# Patient Record
Sex: Male | Born: 1975 | Race: White | Hispanic: No | Marital: Married | Smoking: Never smoker
Health system: Southern US, Community
[De-identification: ages and names within clinical notes are randomized; demographics above are authoritative.]

---

## 2014-06-15 ENCOUNTER — Encounter: Payer: Self-pay | Admitting: Family Medicine

## 2014-06-15 ENCOUNTER — Ambulatory Visit (INDEPENDENT_AMBULATORY_CARE_PROVIDER_SITE_OTHER): Payer: BLUE CROSS/BLUE SHIELD | Admitting: Family Medicine

## 2014-06-15 VITALS — BP 139/94 | HR 64 | Ht 71.0 in | Wt 287.0 lb

## 2014-06-15 DIAGNOSIS — IMO0001 Reserved for inherently not codable concepts without codable children: Secondary | ICD-10-CM

## 2014-06-15 DIAGNOSIS — N529 Male erectile dysfunction, unspecified: Secondary | ICD-10-CM | POA: Insufficient documentation

## 2014-06-15 DIAGNOSIS — G5710 Meralgia paresthetica, unspecified lower limb: Secondary | ICD-10-CM | POA: Insufficient documentation

## 2014-06-15 DIAGNOSIS — R030 Elevated blood-pressure reading, without diagnosis of hypertension: Secondary | ICD-10-CM | POA: Diagnosis not present

## 2014-06-15 DIAGNOSIS — G5711 Meralgia paresthetica, right lower limb: Secondary | ICD-10-CM | POA: Diagnosis not present

## 2014-06-15 LAB — COMPLETE METABOLIC PANEL WITH GFR
ALBUMIN: 4.4 g/dL (ref 3.5–5.2)
ALT: 19 U/L (ref 0–53)
AST: 28 U/L (ref 0–37)
Alkaline Phosphatase: 49 U/L (ref 39–117)
BUN: 8 mg/dL (ref 6–23)
CALCIUM: 9.7 mg/dL (ref 8.4–10.5)
CO2: 24 mEq/L (ref 19–32)
Chloride: 104 mEq/L (ref 96–112)
Creat: 0.94 mg/dL (ref 0.50–1.35)
GFR, Est African American: >89 mL/min
GFR, Est Non African American: >89 mL/min
Glucose, Bld: 80 mg/dL (ref 70–99)
POTASSIUM: 4.4 meq/L (ref 3.5–5.3)
Sodium: 139 mEq/L (ref 135–145)
Total Bilirubin: 0.5 mg/dL (ref 0.2–1.2)
Total Protein: 7.8 g/dL (ref 6.0–8.3)

## 2014-06-15 NOTE — Progress Notes (Signed)
CC: Epimenio SarinDerrick Osborne is a 39 y.o. male is here for Numbness   Subjective: HPI:   Pleasant 39 year old here to establish care, husband of Devonda Jefferies  Complains of a numbness and tingling sensation on the right thigh localized at the distal lateral aspect of the thigh. Been present for about a month. Almost absent in severity when nothing is touching the skin however with any touch symptoms are mild in severity. He denies any recent or remote trauma or joint pain. Denies any coordination difficulty or any other motor or sensory disturbances in any of the extremities. He denies any back pain whatsoever. Symptoms are not getting better or worse since onset and no interventions as of yet. Denies any overlying skin changes at the site.   Complains of difficulty maintaining an erection that has been present for the past 1 or 2 months. He's had no difficulty initiating erection and still feels that he has a strong libido. He notices that symptoms are worsening if he thinks about it while having sex. No interventions as of yet. Denies any other genitourinary complaints including but not limited to dysuria, penile pain, testicular pain or any penile lesions. He is still attracted to his wife.  Review of Systems - General ROS: negative for - chills, fever, night sweats, weight gain or weight loss Ophthalmic ROS: negative for - decreased vision Psychological ROS: negative for - anxiety or depression ENT ROS: negative for - hearing change, nasal congestion, tinnitus or allergies Hematological and Lymphatic ROS: negative for - bleeding problems, bruising or swollen lymph nodes Breast ROS: negative Respiratory ROS: no cough, shortness of breath, or wheezing Cardiovascular ROS: no chest pain or dyspnea on exertion Gastrointestinal ROS: no abdominal pain, change in bowel habits, or black or bloody stools Genito-Urinary ROS: negative for - genital discharge, genital ulcers, incontinence or abnormal bleeding  from genitals Musculoskeletal ROS: negative for - joint pain or muscle pain Neurological ROS: negative for - headaches or memory loss Dermatological ROS: negative for lumps, mole changes, rash and skin lesion changes  No past medical history on file.  No past surgical history on file. Family History  Problem Relation Age of Onset  . Alcohol abuse Father   . Diabetes Other   . Hyperlipidemia Other   . Hypertension Other   . Stroke Other   . Heart attack Other     History   Social History  . Marital Status: Married    Spouse Name: N/A  . Number of Children: N/A  . Years of Education: N/A   Occupational History  . Not on file.   Social History Main Topics  . Smoking status: Never Smoker   . Smokeless tobacco: Not on file  . Alcohol Use: 0.0 oz/week    0 Standard drinks or equivalent per week     Comment: Very rarely  . Drug Use: Not on file  . Sexual Activity: Yes    Birth Control/ Protection: None   Other Topics Concern  . Not on file   Social History Narrative  . No narrative on file     Objective: BP 139/94 mmHg  Pulse 64  Ht 5\' 11"  (1.803 m)  Wt 287 lb (130.182 kg)  BMI 40.05 kg/m2  SpO2 94%  General: Alert and Oriented, No Acute Distress HEENT: Pupils equal, round, reactive to light. Conjunctivae clear.  Moist mucous membranes Lungs: Clear to auscultation bilaterally, no wheezing/ronchi/rales.  Comfortable work of breathing. Good air movement. Cardiac: Regular rate and rhythm. Normal S1/S2.  No murmurs, rubs, nor gallops.   Abdomen: Moderate obesity Neuro: CN II-XII grossly intact, full strength/rom of all four extremities, C5/L4/S1 DTRs 2/4 bilaterally, gait normal, rapid alternating movements normal, heel-shin test normal, Rhomberg normal. Extremities: No peripheral edema.  Strong peripheral pulses.  Mental Status: No depression, anxiety, nor agitation. Skin: Warm and dry. No skin abnormality at the site of his numbness, light touch sensation is intact  here.  Assessment & Plan: Aadi was seen today for numbness.  Diagnoses and all orders for this visit:  Meralgia paraesthetica, right  Erectile dysfunction, unspecified erectile dysfunction type Orders: -     COMPLETE METABOLIC PANEL WITH GFR  Elevated blood pressure Orders: -     COMPLETE METABOLIC PANEL WITH GFR   Discussed benign nature of meralgia paresthetica . There is no necessary treatment however there are some medications that will reduce the symptoms such as Lyrica or gabapentin however he understandably doesn't want to take a medication that could possibly give him any side effects. Discussed loosening his belt to help with resolution of his condition. Erectile dysfunction: Rule out diabetes or metabolic abnormality, if normal will call in prescription for Viagra. High suspicion this is psychogenic since he can initiate a erection. Elevated blood pressure: Discussed sodium restriction and the DASH diet. Increase physical activity with exercising and follow up in one month to repeat blood pressure  Return in about 4 weeks (around 07/13/2014), or BP review.

## 2014-06-15 NOTE — Patient Instructions (Addendum)
We will call you with results of your blood work and if it's causing any symptoms that we discussed today. If everything is fine with your blood work I will call in a prescription that will help with this personal concern that we discussed.  DASH Eating Plan DASH stands for "Dietary Approaches to Stop Hypertension." The DASH eating plan is a healthy eating plan that has been shown to reduce high blood pressure (hypertension). Additional health benefits may include reducing the risk of type 2 diabetes mellitus, heart disease, and stroke. The DASH eating plan may also help with weight loss. WHAT DO I NEED TO KNOW ABOUT THE DASH EATING PLAN? For the DASH eating plan, you will follow these general guidelines:  Choose foods with a percent daily value for sodium of less than 5% (as listed on the food label).  Use salt-free seasonings or herbs instead of table salt or sea salt.  Check with your health care provider or pharmacist before using salt substitutes.  Eat lower-sodium products, often labeled as "lower sodium" or "no salt added."  Eat fresh foods.  Eat more vegetables, fruits, and low-fat dairy products.  Choose whole grains. Look for the word "whole" as the first word in the ingredient list.  Choose fish and skinless chicken or [place] more often than red meat. Limit fish, poultry, and meat to 6 oz (170 g) each day.  Limit sweets, desserts, sugars, and sugary drinks.  Choose heart-healthy fats.  Limit cheese to 1 oz (28 g) per day.  Eat more home-cooked food and less restaurant, buffet, and fast food.  Limit fried foods.  Cook foods using methods other than frying.  Limit canned vegetables. If you do use them, rinse them well to decrease the sodium.  When eating at a restaurant, ask that your food be prepared with less salt, or no salt if possible. WHAT FOODS CAN I EAT? Seek help from a dietitian for individual calorie needs. Grains Whole grain or whole wheat bread. Brown  rice. Whole grain or whole wheat pasta. Quinoa, bulgur, and whole grain cereals. Low-sodium cereals. Corn or whole wheat flour tortillas. Whole grain cornbread. Whole grain crackers. Low-sodium crackers. Vegetables Fresh or frozen vegetables (raw, steamed, roasted, or grilled). Low-sodium or reduced-sodium tomato and vegetable juices. Low-sodium or reduced-sodium tomato sauce and paste. Low-sodium or reduced-sodium canned vegetables.  Fruits All fresh, canned (in natural juice), or frozen fruits. Meat and Other Protein Products Ground beef (85% or leaner), grass-fed beef, or beef trimmed of fat. Skinless chicken or [place]key. Ground chicken or [place]key. Pork trimmed of fat. All fish and seafood. Eggs. Dried beans, peas, or lentils. Unsalted nuts and seeds. Unsalted canned beans. Dairy Low-fat dairy products, such as skim or 1% milk, 2% or reduced-fat cheeses, low-fat ricotta or cottage cheese, or plain low-fat yogurt. Low-sodium or reduced-sodium cheeses. Fats and Oils Tub margarines without trans fats. Light or reduced-fat mayonnaise and salad dressings (reduced sodium). Avocado. Safflower, olive, or canola oils. Natural peanut or almond butter. Other Unsalted popcorn and pretzels. The items listed above may not be a complete list of recommended foods or beverages. Contact your dietitian for more options. WHAT FOODS ARE NOT RECOMMENDED? Grains White bread. White pasta. White rice. Refined cornbread. Bagels and croissants. Crackers that contain trans fat. Vegetables Creamed or fried vegetables. Vegetables in a cheese sauce. Regular canned vegetables. Regular canned tomato sauce and paste. Regular tomato and vegetable juices. Fruits Dried fruits. Canned fruit in light or heavy syrup. Fruit juice. Meat and Other  Protein Products Fatty cuts of meat. Ribs, chicken wings, bacon, sausage, bologna, salami, chitterlings, fatback, hot dogs, bratwurst, and packaged luncheon meats. Salted nuts and seeds.  Canned beans with salt. Dairy Whole or 2% milk, cream, half-and-half, and cream cheese. Whole-fat or sweetened yogurt. Full-fat cheeses or blue cheese. Nondairy creamers and whipped toppings. Processed cheese, cheese spreads, or cheese curds. Condiments Onion and garlic salt, seasoned salt, table salt, and sea salt. Canned and packaged gravies. Worcestershire sauce. Tartar sauce. Barbecue sauce. Teriyaki sauce. Soy sauce, including reduced sodium. Steak sauce. Fish sauce. Oyster sauce. Cocktail sauce. Horseradish. Ketchup and mustard. Meat flavorings and tenderizers. Bouillon cubes. Hot sauce. Tabasco sauce. Marinades. Taco seasonings. Relishes. Fats and Oils Butter, stick margarine, lard, shortening, ghee, and bacon fat. Coconut, palm kernel, or palm oils. Regular salad dressings. Other Pickles and olives. Salted popcorn and pretzels. The items listed above may not be a complete list of foods and beverages to avoid. Contact your dietitian for more information. WHERE CAN I FIND MORE INFORMATION? National Heart, Lung, and Blood Institute: travelstabloid.com Document Released: 01/24/2011 Document Revised: 06/21/2013 Document Reviewed: 12/09/2012 [facility] Patient Information 2015 [place], [place]. This information is not intended to replace advice given to you by your health care provider. Make sure you discuss any questions you have with your health care provider.

## 2014-06-16 ENCOUNTER — Telehealth: Payer: Self-pay | Admitting: Family Medicine

## 2014-06-16 DIAGNOSIS — N529 Male erectile dysfunction, unspecified: Secondary | ICD-10-CM

## 2014-06-16 MED ORDER — SILDENAFIL CITRATE 100 MG PO TABS: 50.0000 mg | ORAL_TABLET | Freq: Every day | ORAL | Status: DC | PRN

## 2014-06-16 NOTE — Telephone Encounter (Signed)
Left discreet message on vm and asked that he call to confirm pharm

## 2014-06-16 NOTE — Telephone Encounter (Signed)
Sue Lushndrea, Will you please let patient know that his kidney function, liver function, and blood sugar were all normal.  I've printed off a Rx for Viagra to help with his personal concerns.  Rx in your inbox, no pharmacy on file.  I'd recommend f/u in 1-2 months to recheck this issue and his BP.

## 2014-06-17 NOTE — Telephone Encounter (Signed)
Pt notified of results when i called to ask him what pharm he uses

## 2014-07-13 ENCOUNTER — Ambulatory Visit: Payer: BLUE CROSS/BLUE SHIELD | Admitting: Family Medicine

## 2014-07-26 ENCOUNTER — Ambulatory Visit: Payer: BLUE CROSS/BLUE SHIELD | Admitting: Family Medicine

## 2015-09-25 ENCOUNTER — Encounter: Payer: BLUE CROSS/BLUE SHIELD | Admitting: Osteopathic Medicine

## 2015-09-25 DIAGNOSIS — Z91199 Patient's noncompliance with other medical treatment and regimen due to unspecified reason: Secondary | ICD-10-CM | POA: Insufficient documentation

## 2015-09-25 DIAGNOSIS — Z5329 Procedure and treatment not carried out because of patient's decision for other reasons: Secondary | ICD-10-CM | POA: Insufficient documentation

## 2016-03-14 ENCOUNTER — Encounter: Payer: Self-pay | Admitting: *Deleted

## 2016-03-14 ENCOUNTER — Other Ambulatory Visit: Payer: Self-pay

## 2016-03-14 ENCOUNTER — Emergency Department (INDEPENDENT_AMBULATORY_CARE_PROVIDER_SITE_OTHER)
Admission: EM | Admit: 2016-03-14 | Discharge: 2016-03-14 | Disposition: A | Discharge status: Home / Self Care | Payer: BLUE CROSS/BLUE SHIELD | Source: Home / Self Care | Attending: Family Medicine | Admitting: Family Medicine

## 2016-03-14 ENCOUNTER — Emergency Department (INDEPENDENT_AMBULATORY_CARE_PROVIDER_SITE_OTHER): Payer: BLUE CROSS/BLUE SHIELD

## 2016-03-14 DIAGNOSIS — R079 Chest pain, unspecified: Secondary | ICD-10-CM | POA: Diagnosis not present

## 2016-03-14 DIAGNOSIS — M940 Chondrocostal junction syndrome [Tietze]: Secondary | ICD-10-CM

## 2016-03-14 MED ORDER — MELOXICAM 15 MG PO TABS: 15.0000 mg | ORAL_TABLET | Freq: Every day | ORAL | 0 refills | Status: DC

## 2016-03-14 NOTE — ED Provider Notes (Signed)
[name]    CSN: [id number] Arrival date & time: 03/14/16  4098     History   Chief Complaint Chief Complaint  Patient presents with  . Chest Pain    HPI Timothy Osborne is a 41 y.o. male.   Patient states that when he awoke yesterday morning, he reached over his chest with his left hand to turn off his alarm clock and felt a sudden sharp pain in his sternum that has persisted.  The pain is worse with chest movement and does not radiate.  No nausea/vomiting.  No shortness of breath.  No cough.  No recent URI.  No fevers, chills, and sweats.  No lower leg pain or swelling.  No recent chest injury.   The history is provided by the patient.  Chest Pain  Pain location:  Substernal area Pain quality: sharp   Pain radiates to:  Does not radiate Pain severity:  Moderate Onset quality:  Sudden Duration:  1 day Timing:  Intermittent Progression:  Unchanged Chronicity:  New Context: breathing, intercourse, movement, raising an arm and at rest   Relieved by:  None tried Worsened by:  Coughing, certain positions and movement Ineffective treatments:  None tried Associated symptoms: no abdominal pain, no AICD problem, no anorexia, no back pain, no claudication, no cough, no diaphoresis, no dysphagia, no fatigue, no fever, no heartburn, no lower extremity edema, no nausea, no near-syncope, no numbness, no palpitations, no PND, no shortness of breath, no syncope and no vomiting   Risk factors: male sex     No past medical history on file.  Patient Active Problem List   Diagnosis Date Noted  . Failure to attend appointment 09/25/2015  . Elevated blood pressure 06/15/2014  . Erectile dysfunction 06/15/2014  . Meralgia paraesthetica 06/15/2014    No past surgical history on file.     Home Medications    Prior to Admission medications   Medication Sig Start Date End Date Taking? Authorizing Provider  meloxicam (MOBIC) 15 MG tablet Take 1 tablet (15 mg total) by  mouth daily. Take with food each morning 03/14/16   Lattie Haw, MD  sildenafil (VIAGRA) 100 MG tablet Take 0.5-1 tablets (50-100 mg total) by mouth daily as needed for erectile dysfunction. 06/16/14   Laren Boom, DO    Family History Family History  Problem Relation Age of Onset  . Diabetes Other   . Hyperlipidemia Other   . Hypertension Other   . Stroke Other   . Heart attack Other   . Alcohol abuse Father     Social History Social History  Substance Use Topics  . Smoking status: Never Smoker  . Smokeless tobacco: Never Used  . Alcohol use 0.0 oz/week     Comment: Very rarely     Allergies   Patient has no known allergies.   Review of Systems Review of Systems  Constitutional: Negative for diaphoresis, fatigue and fever.  HENT: Negative for trouble swallowing.   Respiratory: Negative for cough and shortness of breath.   Cardiovascular: Positive for chest pain. Negative for palpitations, claudication, syncope, PND and near-syncope.  Gastrointestinal: Negative for abdominal pain, anorexia, heartburn, nausea and vomiting.  Musculoskeletal: Negative for back pain.  Neurological: Negative for numbness.  All other systems reviewed and are negative.    Physical Exam Triage Vital Signs ED Triage Vitals  Enc Vitals Group     BP 03/14/16 1017 142/94     Pulse Rate 03/14/16 1017 81  Resp 03/14/16 1017 16     Temp --      Temp src --      SpO2 03/14/16 1017 97 %     Weight 03/14/16 1018 280 lb (127 kg)     Height --      Head Circumference --      Peak Flow --      Pain Score 03/14/16 1018 4     Pain Loc --      Pain Edu? --      Excl. in GC? --    No data found.   Updated Vital Signs BP 142/94 (BP Location: Left Arm)   Pulse 81   Resp 16   Wt 280 lb (127 kg)   SpO2 97%   BMI 39.05 kg/m   Visual Acuity Right Eye Distance:   Left Eye Distance:   Bilateral Distance:    Right Eye Near:   Left Eye Near:    Bilateral Near:     Physical Exam    Constitutional: He appears well-developed and well-nourished. No distress.  HENT:  Head: Normocephalic.  Right Ear: External ear normal.  Left Ear: External ear normal.  Nose: Nose normal.  Mouth/Throat: Oropharynx is clear and moist.  Eyes: Conjunctivae are normal. Pupils are equal, round, and reactive to light.  Neck: Normal range of motion. Neck supple.  Cardiovascular: Normal heart sounds.   Pulmonary/Chest: Breath sounds normal. He exhibits tenderness.    Chest:  Distinct tenderness to palpation over the mid-sternum.  Palpation over the pectoralis muscles during contraction recreates his pain.  Abdominal: There is no tenderness.  Musculoskeletal: He exhibits no edema or tenderness.  Lymphadenopathy:    He has no cervical adenopathy.  Neurological: He is alert.  Skin: Skin is warm and dry. No rash noted.  Nursing note and vitals reviewed.    UC Treatments / Results  Labs (all labs ordered are listed, but only abnormal results are displayed) Labs Reviewed - No data to display  EKG  EKG Interpretation    Rate:  70 BPM PR:  182 msec QT:  404 msec QTcH:  436 msec QRSD:  112 msec QRS axis:  -15 degrees Interpretation:   Normal sinus rhythm; no acute changes.       Radiology Dg Chest 2 View  Result Date: 03/14/2016 CLINICAL DATA:  Central chest pain EXAM: CHEST  2 VIEW COMPARISON:  None. FINDINGS: Heart and mediastinal contours are within normal limits. No focal opacities or effusions. No acute bony abnormality. IMPRESSION: No active cardiopulmonary disease. Electronically Signed   By: Charlett NoseKevin  Dover M.D.   On: 03/14/2016 12:13    Procedures Procedures (including critical care time)  Medications Ordered in UC Medications - No data to display   Initial Impression / Assessment and Plan / UC Course  I have reviewed the triage vital signs and the nursing notes.  Pertinent labs & imaging results that were available during my care of the patient were reviewed by me  and considered in my medical decision making (see chart for details).    No evidence ACS.  Patient reassured. Administered Ibuprofen 800mg  PO Begin Mobic Apply ice pack for 20 to 30 minutes, 3 to 4 times daily  Continue until pain and swelling decrease.  Followup with Dr. Rodney Langtonhomas Thekkekandam or Dr. Clementeen GrahamEvan Corey ([facility]) if not improving about two weeks.     Final Clinical Impressions(s) / UC Diagnoses   Final diagnoses:  Costochondritis    New Prescriptions  New Prescriptions   MELOXICAM (MOBIC) 15 MG TABLET    Take 1 tablet (15 mg total) by mouth daily. Take with food each morning     Lattie Haw, MD 03/17/16 [phone]

## 2016-03-14 NOTE — ED Triage Notes (Signed)
Patient awoke yesterday with central chest pain. Pain is worse with cough and movement. No recent URI or illness. Denis, diaphoresis, dizziness or SOB.

## 2016-03-14 NOTE — Discharge Instructions (Signed)
Apply ice pack for 20 to 30 minutes, 3 to 4 times daily  Continue until pain and swelling decrease.  °

## 2017-03-29 ENCOUNTER — Emergency Department (INDEPENDENT_AMBULATORY_CARE_PROVIDER_SITE_OTHER)
Admission: EM | Admit: 2017-03-29 | Discharge: 2017-03-29 | Disposition: A | Discharge status: Home / Self Care | Payer: BLUE CROSS/BLUE SHIELD | Source: Home / Self Care | Attending: Family Medicine | Admitting: Family Medicine

## 2017-03-29 ENCOUNTER — Emergency Department (INDEPENDENT_AMBULATORY_CARE_PROVIDER_SITE_OTHER): Payer: BLUE CROSS/BLUE SHIELD

## 2017-03-29 ENCOUNTER — Encounter: Payer: Self-pay | Admitting: Emergency Medicine

## 2017-03-29 DIAGNOSIS — B9789 Other viral agents as the cause of diseases classified elsewhere: Secondary | ICD-10-CM | POA: Diagnosis not present

## 2017-03-29 DIAGNOSIS — J069 Acute upper respiratory infection, unspecified: Secondary | ICD-10-CM

## 2017-03-29 DIAGNOSIS — R05 Cough: Secondary | ICD-10-CM

## 2017-03-29 DIAGNOSIS — R0989 Other specified symptoms and signs involving the circulatory and respiratory systems: Secondary | ICD-10-CM

## 2017-03-29 MED ORDER — ALBUTEROL SULFATE HFA 108 (90 BASE) MCG/ACT IN AERS: 1.0000 | INHALATION_SPRAY | Freq: Four times a day (QID) | RESPIRATORY_TRACT | 0 refills | Status: AC | PRN

## 2017-03-29 MED ORDER — PREDNISONE 20 MG PO TABS: ORAL_TABLET | ORAL | 0 refills | Status: AC

## 2017-03-29 MED ORDER — BENZONATATE 100 MG PO CAPS: 100.0000 mg | ORAL_CAPSULE | Freq: Three times a day (TID) | ORAL | 0 refills | Status: AC

## 2017-03-29 NOTE — ED Triage Notes (Signed)
Patient complaining of fatigue, runny nose, wheezing in chest x 1 day, productive cough.  Been taking Nyquil and Dayquil.

## 2017-03-29 NOTE — Discharge Instructions (Signed)
°  You may take 500mg acetaminophen every 4-6 hours or in combination with ibuprofen 400-600mg every 6-8 hours as needed for pain, inflammation, and fever. ° °Be sure to drink at least eight 8oz glasses of water to stay well hydrated and get at least 8 hours of sleep at night, preferably more while sick.  ° °

## 2017-03-29 NOTE — ED Provider Notes (Signed)
[name]    CSN: [id number] Arrival date & time: 03/29/17  1134     History   Chief Complaint Chief Complaint  Patient presents with  . URI    HPI Timothy Osborne is a 42 y.o. male.   HPI  Timothy Osborne is a 42 y.o. male presenting to UC with c/o fatigue, rhinorrhea, wheeze in chest, and mildly productive cough since yesterday.  He notes a family member at home was recently dx with pneumonia.  Pt's wife, who is accompanying him today, is concerned pt may have pneumonia. No prior hx of pneumonia.  He has been taking Nyquil and Dayquil with temporary relief.  Denies fever but has had chills. Denies n/v/d. No hx of asthma or COPD.    History reviewed. No pertinent past medical history.  Patient Active Problem List   Diagnosis Date Noted  . Failure to attend appointment 09/25/2015  . Elevated blood pressure 06/15/2014  . Erectile dysfunction 06/15/2014  . Meralgia paraesthetica 06/15/2014    History reviewed. No pertinent surgical history.     Home Medications    Prior to Admission medications   Medication Sig Start Date End Date Taking? Authorizing Provider  albuterol (PROVENTIL HFA;VENTOLIN HFA) 108 (90 Base) MCG/ACT inhaler Inhale 1-2 puffs into the lungs every 6 (six) hours as needed for wheezing or shortness of breath. 03/29/17   Lurene Shadow, PA-C  benzonatate (TESSALON) 100 MG capsule Take 1-2 capsules (100-200 mg total) by mouth every 8 (eight) hours. 03/29/17   Lurene Shadow, PA-C  predniSONE (DELTASONE) 20 MG tablet 3 tabs po day one, then 2 po daily x 4 days 03/29/17   Lurene Shadow, PA-C    Family History Family History  Problem Relation Age of Onset  . Diabetes Other   . Hyperlipidemia Other   . Hypertension Other   . Stroke Other   . Heart attack Other   . Alcohol abuse Father     Social History Social History   Tobacco Use  . Smoking status: Never Smoker  . Smokeless tobacco: Never Used  Substance Use Topics  . Alcohol use: Yes      Alcohol/week: 0.0 oz    Comment: Very rarely  . Drug use: No     Allergies   Patient has no known allergies.   Review of Systems Review of Systems  Constitutional: Positive for chills. Negative for fever.  HENT: Positive for congestion and rhinorrhea. Negative for ear pain, sore throat, trouble swallowing and voice change.   Respiratory: Positive for cough, chest tightness and wheezing. Negative for shortness of breath.   Cardiovascular: Negative for chest pain and palpitations.  Gastrointestinal: Negative for abdominal pain, diarrhea, nausea and vomiting.  Musculoskeletal: Negative for arthralgias, back pain and myalgias.  Skin: Negative for rash.  Neurological: Positive for headaches. Negative for dizziness and light-headedness.     Physical Exam Triage Vital Signs ED Triage Vitals  Enc Vitals Group     BP 03/29/17 1156 (!) 155/100     Pulse Rate 03/29/17 1156 62     Resp --      Temp 03/29/17 1156 98.2 F (36.8 C)     Temp Source 03/29/17 1156 Oral     SpO2 03/29/17 1156 98 %     Weight 03/29/17 1157 266 lb 4 oz (120.8 kg)     Height 03/29/17 1157 5\' 11"  (1.803 m)     Head Circumference --      Peak Flow --  Pain Score 03/29/17 1157 0     Pain Loc --      Pain Edu? --      Excl. in GC? --    No data found.  Updated Vital Signs BP (!) 155/100 (BP Location: Right Arm)   Pulse 62   Temp 98.2 F (36.8 C) (Oral)   Ht 5\' 11"  (1.803 m)   Wt 266 lb 4 oz (120.8 kg)   SpO2 98%   BMI 37.13 kg/m   Visual Acuity Right Eye Distance:   Left Eye Distance:   Bilateral Distance:    Right Eye Near:   Left Eye Near:    Bilateral Near:     Physical Exam  Constitutional: He is oriented to person, place, and time. He appears well-developed and well-nourished. No distress.  HENT:  Head: Normocephalic and atraumatic.  Right Ear: Tympanic membrane normal.  Left Ear: Tympanic membrane normal.  Nose: Nose normal.  Mouth/Throat: Uvula is midline, oropharynx is  clear and moist and mucous membranes are normal.  Eyes: EOM are normal.  Neck: Normal range of motion. Neck supple.  Cardiovascular: Normal rate and regular rhythm.  Pulmonary/Chest: Effort normal. No stridor. No respiratory distress. He has wheezes (faint expiratory in lower lung fields). He has no rales.  Musculoskeletal: Normal range of motion.  Lymphadenopathy:    He has no cervical adenopathy.  Neurological: He is alert and oriented to person, place, and time.  Skin: Skin is warm and dry. He is not diaphoretic.  Psychiatric: He has a normal mood and affect. His behavior is normal.  Nursing note and vitals reviewed.    UC Treatments / Results  Labs (all labs ordered are listed, but only abnormal results are displayed) Labs Reviewed - No data to display  EKG  EKG Interpretation None       Radiology Dg Chest 2 View  Result Date: 03/29/2017 CLINICAL DATA:  Cough, congestion EXAM: CHEST  2 VIEW COMPARISON:  03/14/2016 FINDINGS: Heart and mediastinal contours are within normal limits. No focal opacities or effusions. No acute bony abnormality. IMPRESSION: No active cardiopulmonary disease. Electronically Signed   By: Charlett NoseKevin  Dover M.D.   On: 03/29/2017 12:27    Procedures Procedures (including critical care time)  Medications Ordered in UC Medications - No data to display   Initial Impression / Assessment and Plan / UC Course  I have reviewed the triage vital signs and the nursing notes.  Pertinent labs & imaging results that were available during my care of the patient were reviewed by me and considered in my medical decision making (see chart for details).     CXR: no acute findings Reassured pt no evidence of pneumonia at this time Encouraged symptomatic treatment May take medications as prescribed to help with chest tightness and cough F/u with PCP in 1 week if not improving.    Final Clinical Impressions(s) / UC Diagnoses   Final diagnoses:  Viral URI with  cough    ED Discharge Orders        Ordered    predniSONE (DELTASONE) 20 MG tablet     03/29/17 1238    albuterol (PROVENTIL HFA;VENTOLIN HFA) 108 (90 Base) MCG/ACT inhaler  Every 6 hours PRN     03/29/17 1238    benzonatate (TESSALON) 100 MG capsule  Every 8 hours     03/29/17 1238       Controlled Substance Prescriptions [place] Controlled Substance Registry consulted? Not Applicable   Rolla Platehelps, Ester Mabe O, PA-C 03/29/17  1309  

## 2017-09-15 ENCOUNTER — Ambulatory Visit: Payer: BLUE CROSS/BLUE SHIELD | Admitting: Physician Assistant

## 2017-10-03 ENCOUNTER — Ambulatory Visit: Payer: BLUE CROSS/BLUE SHIELD | Admitting: Physician Assistant

## 2017-10-16 ENCOUNTER — Ambulatory Visit: Payer: BLUE CROSS/BLUE SHIELD | Admitting: Physician Assistant

## 2018-01-28 IMAGING — DX DG CHEST 2V
2 series · 2 of 2 positions shown · non-contrast
Comparison: None.

CLINICAL DATA: Central chest pain

EXAM:
CHEST  2 VIEW

[chest pa]
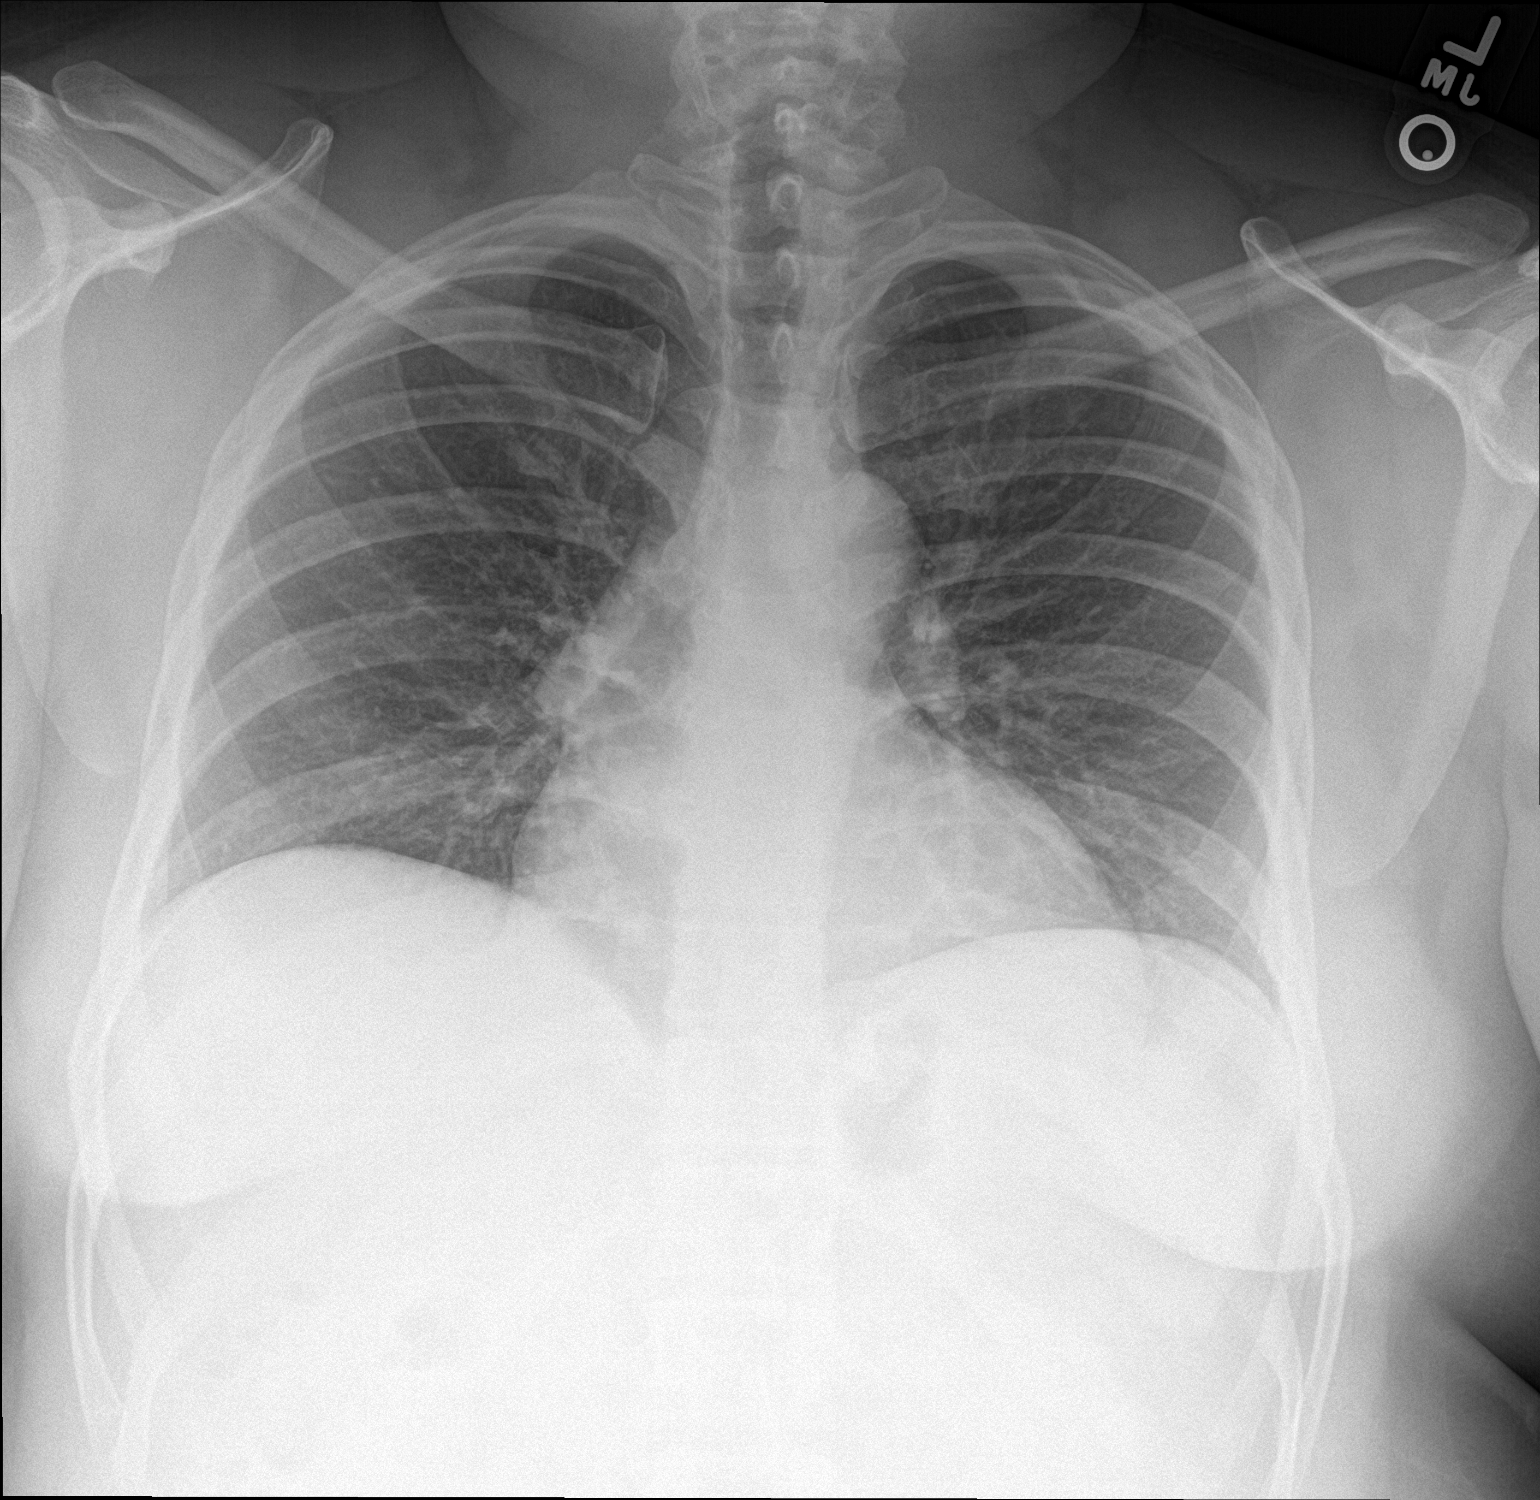

[chest lat]
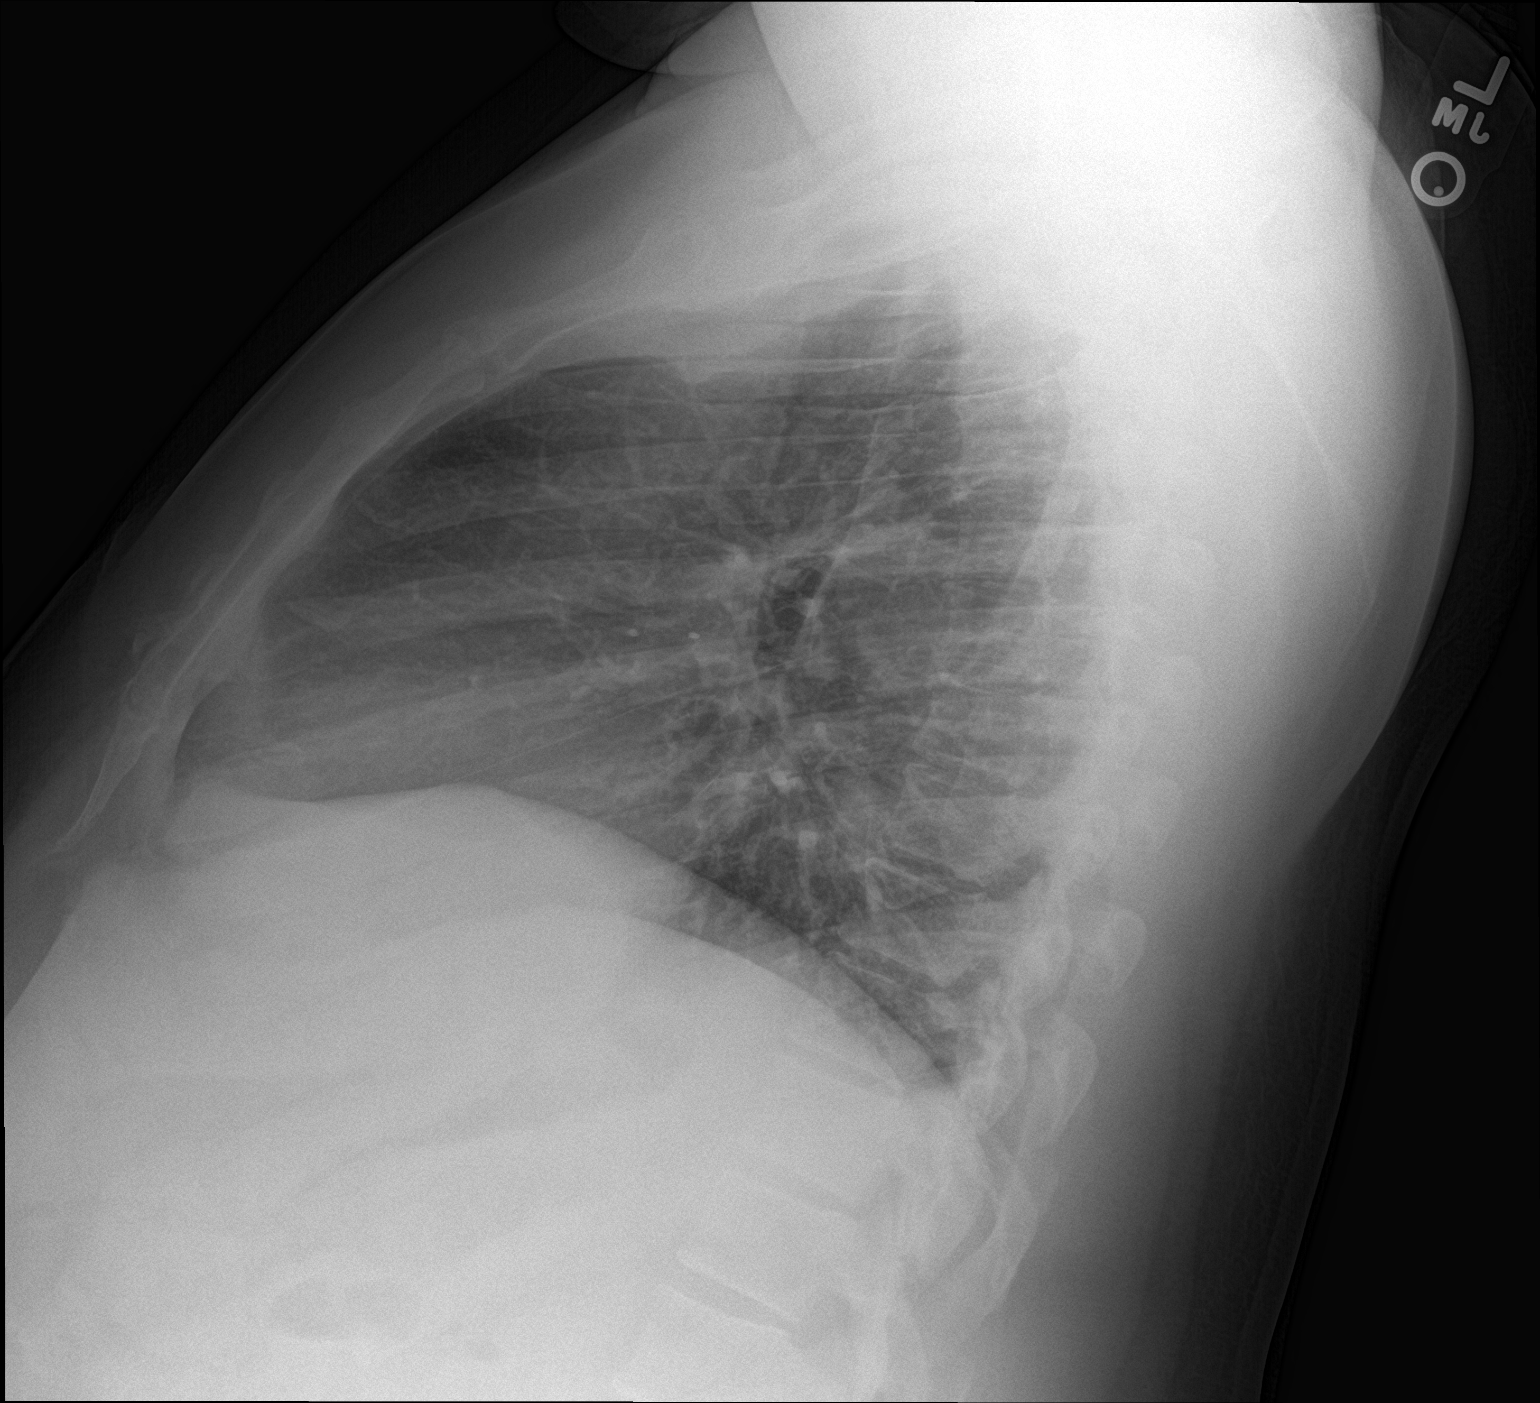

[2 of 2 positions shown; findings below may reference images not displayed]

FINDINGS: Heart and mediastinal contours are within normal limits. No focal
opacities or effusions. No acute bony abnormality.
IMPRESSION: No active cardiopulmonary disease.

## 2019-02-12 IMAGING — DX DG CHEST 2V
2 series · 2 of 2 positions shown · non-contrast
Comparison: 03/14/2016

CLINICAL DATA: Cough, congestion

EXAM:
CHEST  2 VIEW

[chest pa]
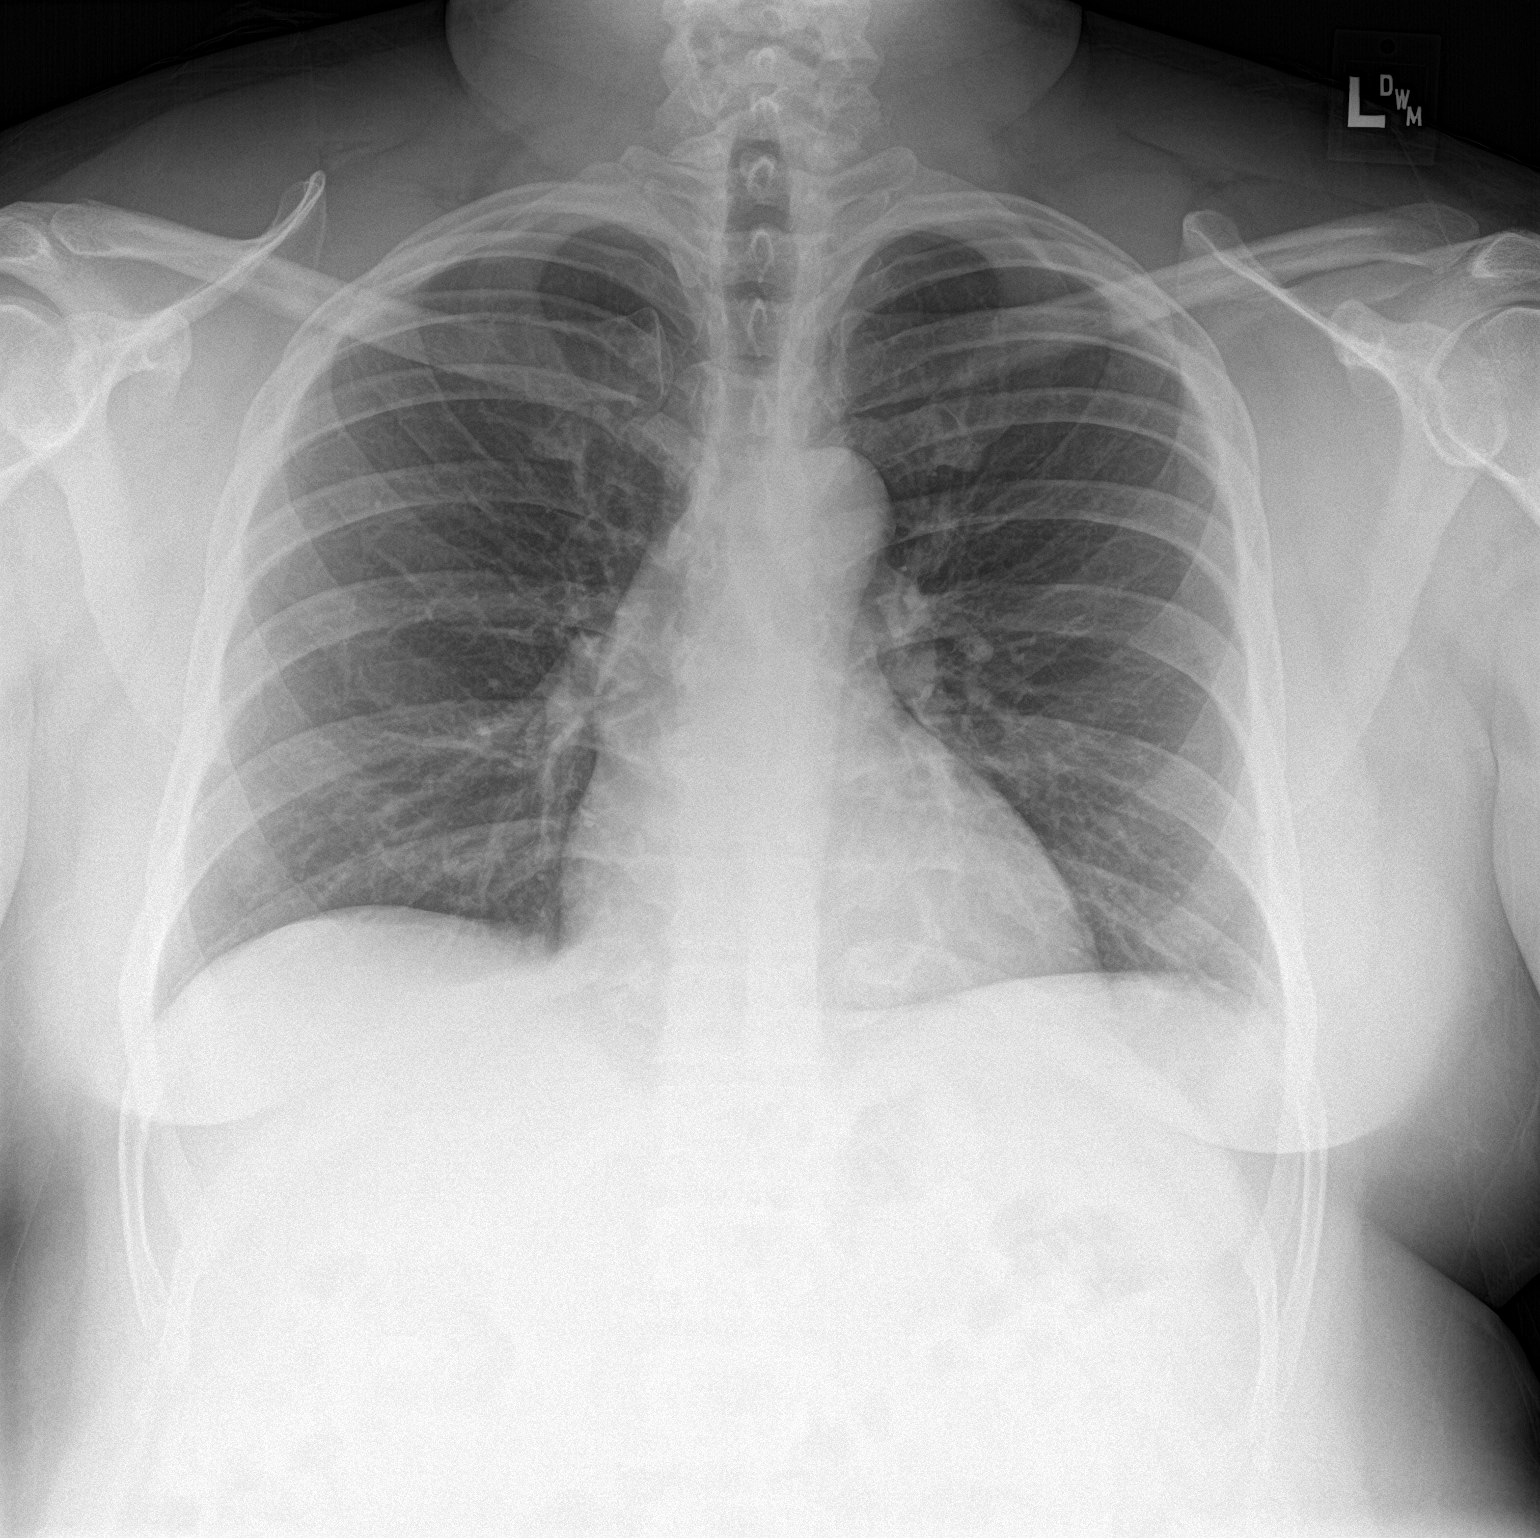

[chest lat]
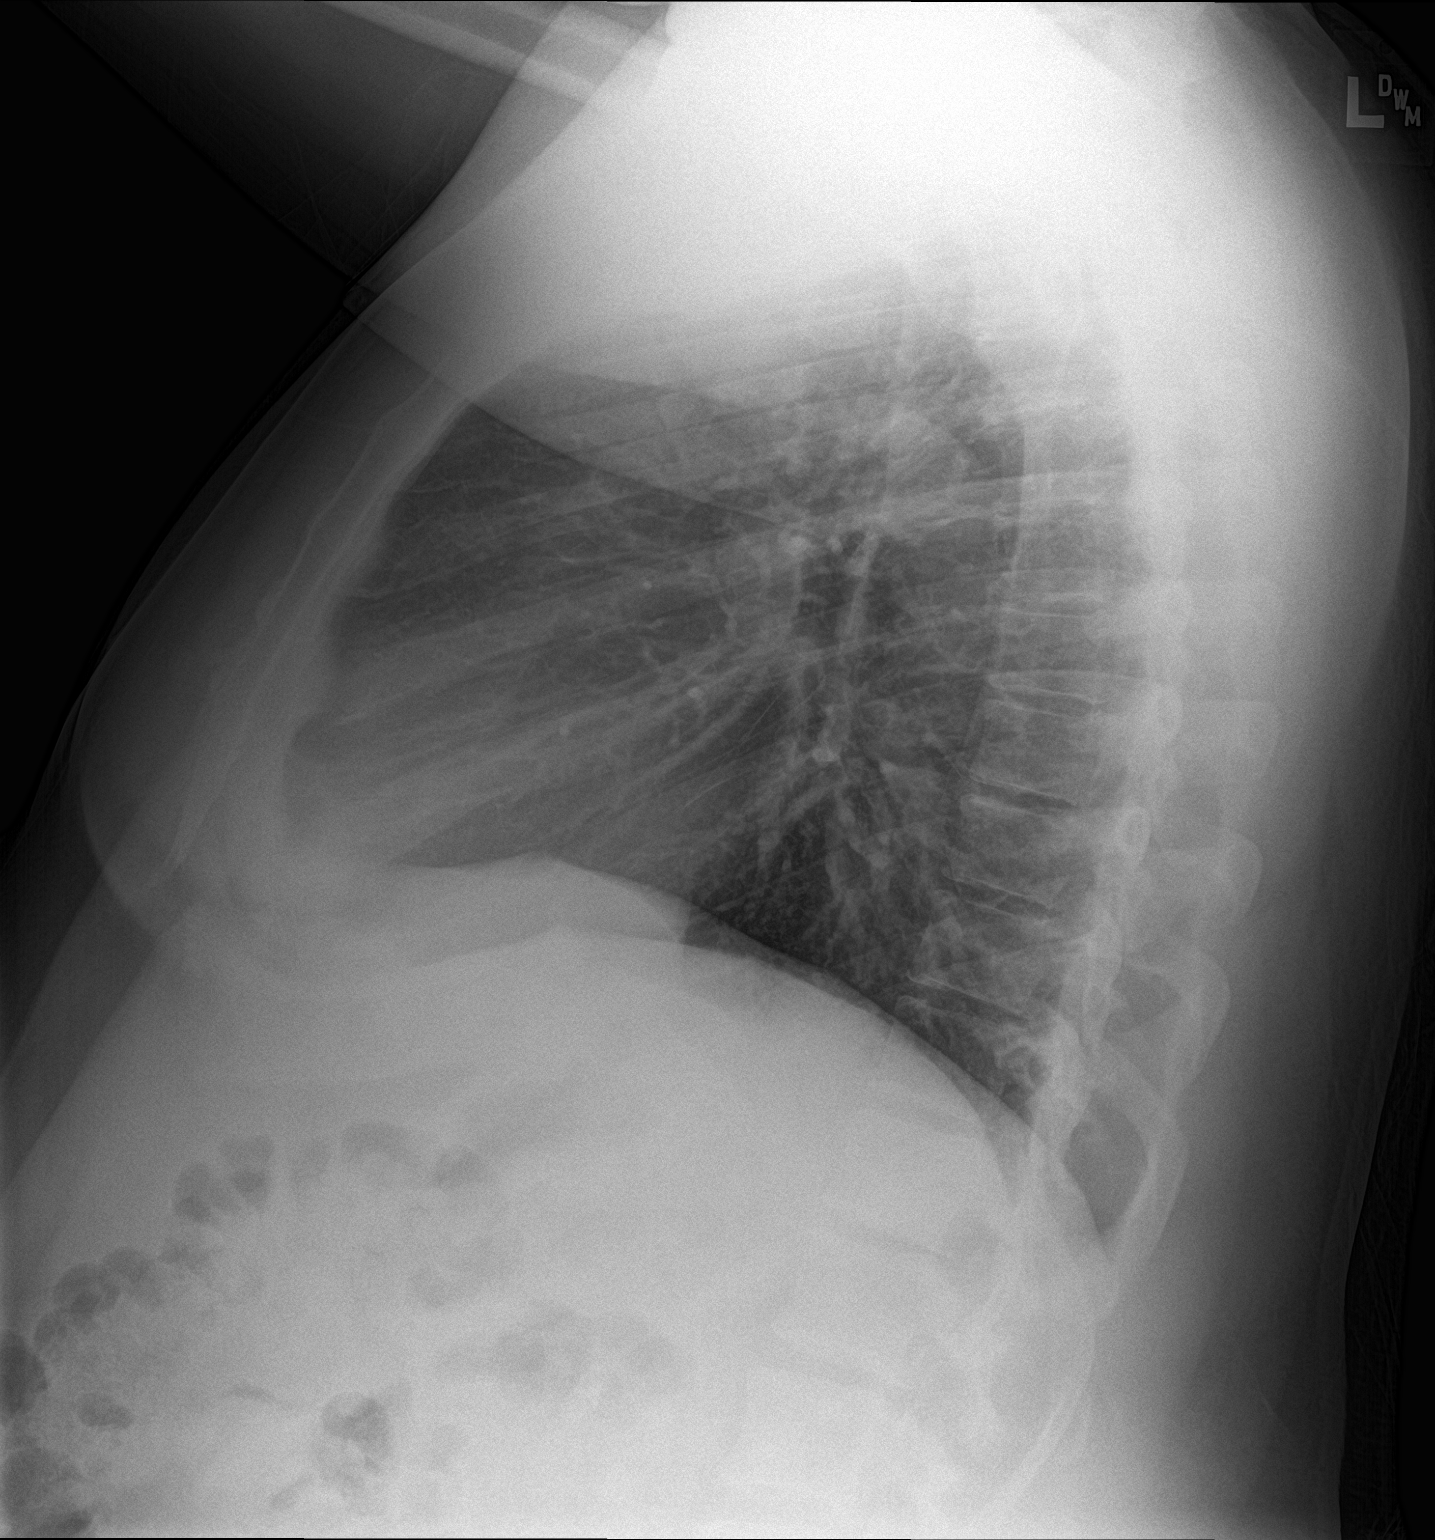

[2 of 2 positions shown; findings below may reference images not displayed]

FINDINGS: Heart and mediastinal contours are within normal limits. No focal
opacities or effusions. No acute bony abnormality.
IMPRESSION: No active cardiopulmonary disease.
